# Patient Record
Sex: Male | Born: 1975 | Race: Black or African American | Hispanic: No | Marital: Single
Health system: Southern US, Community
[De-identification: ages and names within clinical notes are randomized; demographics above are authoritative.]

## PROBLEM LIST (undated history)

## (undated) DIAGNOSIS — I10 Essential (primary) hypertension: Secondary | ICD-10-CM

---

## 2018-04-12 ENCOUNTER — Emergency Department (HOSPITAL_COMMUNITY): Payer: Self-pay

## 2018-04-12 ENCOUNTER — Other Ambulatory Visit: Payer: Self-pay

## 2018-04-12 ENCOUNTER — Emergency Department (HOSPITAL_COMMUNITY)
Admission: EM | Admit: 2018-04-12 | Discharge: 2018-04-12 | Disposition: A | Discharge status: Home / Self Care | Payer: Self-pay | Attending: Emergency Medicine | Admitting: Emergency Medicine

## 2018-04-12 ENCOUNTER — Encounter (HOSPITAL_COMMUNITY): Payer: Self-pay | Admitting: Emergency Medicine

## 2018-04-12 DIAGNOSIS — N50811 Right testicular pain: Secondary | ICD-10-CM | POA: Insufficient documentation

## 2018-04-12 DIAGNOSIS — R1030 Lower abdominal pain, unspecified: Secondary | ICD-10-CM

## 2018-04-12 DIAGNOSIS — N50812 Left testicular pain: Secondary | ICD-10-CM | POA: Insufficient documentation

## 2018-04-12 DIAGNOSIS — N50819 Testicular pain, unspecified: Secondary | ICD-10-CM

## 2018-04-12 LAB — COMPREHENSIVE METABOLIC PANEL
ALT: 22 U/L (ref 0–44)
AST: 19 U/L (ref 15–41)
Albumin: 3.8 g/dL (ref 3.5–5.0)
Alkaline Phosphatase: 74 U/L (ref 38–126)
Anion gap: 6 (ref 5–15)
BUN: 9 mg/dL (ref 6–20)
CHLORIDE: 104 mmol/L (ref 98–111)
CO2: 28 mmol/L (ref 22–32)
Calcium: 9.1 mg/dL (ref 8.9–10.3)
Creatinine, Ser: 1.09 mg/dL (ref 0.61–1.24)
GFR calc Af Amer: >60 mL/min (ref >60)
GFR calc non Af Amer: >60 mL/min (ref >60)
Glucose, Bld: 93 mg/dL (ref 70–99)
Potassium: 3.6 mmol/L (ref 3.5–5.1)
Sodium: 138 mmol/L (ref 135–145)
Total Bilirubin: 0.8 mg/dL (ref 0.3–1.2)
Total Protein: 6.6 g/dL (ref 6.5–8.1)

## 2018-04-12 LAB — CBC WITH DIFFERENTIAL/PLATELET
Abs Immature Granulocytes: 0.03 10*3/uL (ref 0.00–0.07)
BASOS PCT: 1 %
Basophils Absolute: 0 10*3/uL (ref 0.0–0.1)
Eosinophils Absolute: 0.3 10*3/uL (ref 0.0–0.5)
Eosinophils Relative: 4 %
HCT: 41.5 % (ref 39.0–52.0)
Hemoglobin: 13.9 g/dL (ref 13.0–17.0)
Immature Granulocytes: 0 %
Lymphocytes Relative: 28 %
Lymphs Abs: 2 10*3/uL (ref 0.7–4.0)
MCH: 28.5 pg (ref 26.0–34.0)
MCHC: 33.5 g/dL (ref 30.0–36.0)
MCV: 85 fL (ref 80.0–100.0)
MONOS PCT: 6 %
Monocytes Absolute: 0.5 10*3/uL (ref 0.1–1.0)
Neutro Abs: 4.3 10*3/uL (ref 1.7–7.7)
Neutrophils Relative %: 61 %
Platelets: 183 10*3/uL (ref 150–400)
RBC: 4.88 MIL/uL (ref 4.22–5.81)
RDW: 13.6 % (ref 11.5–15.5)
WBC: 7.1 10*3/uL (ref 4.0–10.5)
nRBC: 0 % (ref 0.0–0.2)

## 2018-04-12 LAB — URINALYSIS, ROUTINE W REFLEX MICROSCOPIC
Bilirubin Urine: NEGATIVE
Glucose, UA: NEGATIVE mg/dL
Hgb urine dipstick: NEGATIVE
Ketones, ur: NEGATIVE mg/dL
Leukocytes,Ua: NEGATIVE
Nitrite: NEGATIVE
Protein, ur: NEGATIVE mg/dL
Specific Gravity, Urine: 1.015 (ref 1.005–1.030)
pH: 5 (ref 5.0–8.0)

## 2018-04-12 LAB — LIPASE, BLOOD: Lipase: 64 U/L — ABNORMAL HIGH (ref 11–51)

## 2018-04-12 MED ORDER — AZITHROMYCIN 250 MG PO TABS
1000.0000 mg | ORAL_TABLET | Freq: Once | ORAL | Status: AC
  Administered 2018-04-12: 1000 mg via ORAL
  Filled 2018-04-12: qty 4

## 2018-04-12 MED ORDER — IBUPROFEN 600 MG PO TABS: 600.0000 mg | ORAL_TABLET | Freq: Four times a day (QID) | ORAL | 0 refills | Status: AC | PRN

## 2018-04-12 MED ORDER — CEFTRIAXONE SODIUM 250 MG IJ SOLR
250.0000 mg | Freq: Once | INTRAMUSCULAR | Status: AC
  Administered 2018-04-12: 250 mg via INTRAMUSCULAR
  Filled 2018-04-12: qty 250

## 2018-04-12 MED ORDER — DOXYCYCLINE HYCLATE 100 MG PO CAPS: 100.0000 mg | ORAL_CAPSULE | Freq: Two times a day (BID) | ORAL | 0 refills | Status: AC

## 2018-04-12 NOTE — ED Provider Notes (Signed)
[facility] EMERGENCY DEPARTMENT Provider Note   CSN: [id number] Arrival date & time: 04/12/18  1247    History   Chief Complaint Chief Complaint  Patient presents with  . Abdominal Pain    HPI Kenneth Banks is a 43 y.o. male.     The history is provided by the patient. No language interpreter was used.  Abdominal Pain     43 year old male here with abd pain patient report for the past week he has had generalized abdominal pain.  He described pain as a gradual onset, pressure sensation to his mid abdomen that is constant rates as 4 out of 10.  He also endorsed tenderness to both of the testicles at the same time.  Pain has been present.  He denies any associated fever chills no nausea vomiting diarrhea dysuria hematuria penile discharge or rash.  He is sexually active and using protection.  He report remote history of chlamydia infection.  He denies any postprandial pain and no back pain.  No recent injury.  No specific treatment tried.  Patient works night shift and today is the first day off thus giving him the options to come to the ER.    History reviewed. No pertinent past medical history.  There are no active problems to display for this patient.   The histories are not reviewed yet. Please review them in the "History" navigator section and refresh this SmartLink.      Home Medications    Prior to Admission medications   Not on File    Family History No family history on file.  Social History Social History   Tobacco Use  . Smoking status: Not on file  Substance Use Topics  . Alcohol use: Not on file  . Drug use: Not on file     Allergies   Patient has no allergy information on record.   Review of Systems Review of Systems  Gastrointestinal: Positive for abdominal pain.  All other systems reviewed and are negative.    Physical Exam Updated Vital Signs BP (!) 148/83 (BP Location: Right Arm)   Pulse 61   Temp 98.2 F (36.8  C) (Oral)   Resp 16   Ht 5' 11.5" (1.816 m)   SpO2 100%   Physical Exam Vitals signs and nursing note reviewed. Exam conducted with a chaperone present.  Constitutional:      General: He is not in acute distress.    Appearance: He is well-developed.  HENT:     Head: Atraumatic.  Eyes:     Conjunctiva/sclera: Conjunctivae normal.  Neck:     Musculoskeletal: Neck supple.  Cardiovascular:     Rate and Rhythm: Normal rate and regular rhythm.  Pulmonary:     Effort: Pulmonary effort is normal.     Breath sounds: Normal breath sounds.  Abdominal:     General: Abdomen is flat. Bowel sounds are normal.     Palpations: Abdomen is soft.     Tenderness: There is no abdominal tenderness.     Hernia: No hernia is present.  Genitourinary:    Penis: Normal.      Scrotum/Testes:        Right: Tenderness present. Mass or swelling not present.        Left: Tenderness present. Mass or swelling not present.  Skin:    Findings: No rash.  Neurological:     Mental Status: He is alert.  Psychiatric:        Mood  and Affect: Mood normal.      ED Treatments / Results  Labs (all labs ordered are listed, but only abnormal results are displayed) Labs Reviewed  LIPASE, BLOOD - Abnormal; Notable for the following components:      Result Value   Lipase 64 (*)    All other components within normal limits  CBC WITH DIFFERENTIAL/PLATELET  COMPREHENSIVE METABOLIC PANEL  URINALYSIS, ROUTINE W REFLEX MICROSCOPIC  RPR  HIV ANTIBODY (ROUTINE TESTING W REFLEX)  GC/CHLAMYDIA PROBE AMP ([place]) [facility]    EKG None  Radiology [place] Scrotum W/doppler  Result Date: 04/12/2018 CLINICAL DATA:  Bilateral testicular pain for 5 days EXAM: SCROTAL ULTRASOUND DOPPLER ULTRASOUND OF THE TESTICLES TECHNIQUE: Complete ultrasound examination of the testicles, epididymis, and other scrotal structures was performed. Color and spectral Doppler ultrasound were also utilized to evaluate blood flow to the  testicles. COMPARISON:  None. FINDINGS: Right testicle Measurements: 49 x 25 x 36 mm.  No mass or abnormal vascularity Left testicle Measurements:  48 x 23 x 32 mm.  No mass or abnormal vascularity. Right epididymis:  Incidental 6 mm cyst. Left epididymis: Mildly thicker than the right but no clear asymmetric vascularity. Hydrocele:  None visualized. Varicocele:  None visualized. Pulsed Doppler interrogation of both testes demonstrates normal low resistance arterial and venous waveforms bilaterally. IMPRESSION: 1. Mild comparative thickening of the left epididymis without hypervascularity to confirm epididymitis. Please correlate for focal symptoms in this patient with provided history of bilateral testicular pain. 2. Normal appearance of the testicles. Electronically Signed   By: Marnee SpringJonathon  Watts M.D.   On: 04/12/2018 15:35    Procedures Procedures (including critical care time)  Medications Ordered in ED Medications  cefTRIAXone (ROCEPHIN) injection 250 mg (has no administration in time range)  azithromycin (ZITHROMAX) tablet 1,000 mg (has no administration in time range)     Initial Impression / Assessment and Plan / ED Course  I have reviewed the triage vital signs and the nursing notes.  Pertinent labs & imaging results that were available during my care of the patient were reviewed by me and considered in my medical decision making (see chart for details).        BP (!) 144/81   Pulse 70   Temp 98.2 F (36.8 C) (Oral)   Resp 16   Ht 5' 11.5" (1.816 m)   SpO2 99%    Final Clinical Impressions(s) / ED Diagnoses   Final diagnoses:  Lower abdominal pain  Testicular pain    ED Discharge Orders         Ordered    doxycycline (VIBRAMYCIN) 100 MG capsule  2 times daily     04/12/18 1552    ibuprofen (ADVIL,MOTRIN) 600 MG tablet  Every 6 hours PRN     04/12/18 1552         1:37 PM Patient complaining of pain to the pit of his abdomen as well as bilateral testicle pain  right greater than left.  Pain is been present for nearly a week.  Pain is not significant.  On exam he does have some palpable mass on his right testicle suggestive of hydrocele versus varicocele.  No hernia noted.  Given his discomfort and concern, will obtain scrotal ultrasound to assess for potential mass or torsion.  We will also screen perform STI screening.  Work-up initiated.  Patient otherwise well-appearing   3:42 PM UA without any signs of urinary tract infection, normal WBC, normal H&H mild elevated lipase of  64 however abdomen is nontender to the left upper quadrant to suggest pancreatitis.  Scrotal ultrasound demonstrate mild comparative thickening of the left epididymis without hypervascularity to confirm epididymitis.  Incidental right epididymal cyst measuring approximately 6 mm.  Given that patient has new sexual partner, having bilateral testicles pain with mild thickening of the epididymis, patient will receive antibiotic to treat for suspected epididymitis.   Fayrene Helper, PA-C 04/12/18 1554    Gerhard Munch, MD 04/16/18 2114

## 2018-04-12 NOTE — ED Triage Notes (Signed)
Pt endorses generalized abd pain x1 week. Denies any N/V/D. States it feels like a pressure

## 2018-04-12 NOTE — ED Notes (Signed)
Patient transported to Ultrasound 

## 2018-04-12 NOTE — Discharge Instructions (Signed)
Take antibiotic as prescribed as treatment for inflammation and likely infection of your epididymal region.  Return if your condition worsen or if you have any concerns.

## 2018-04-12 NOTE — ED Notes (Signed)
Patient verbalizes understanding of discharge instructions. Opportunity for questioning and answers were provided. Armband removed by staff, pt discharged from ED ambulatory to home.  

## 2018-04-13 LAB — HIV ANTIBODY (ROUTINE TESTING W REFLEX): HIV Screen 4th Generation wRfx: NONREACTIVE

## 2018-04-13 LAB — RPR: RPR Ser Ql: NONREACTIVE

## 2018-04-14 LAB — GC/CHLAMYDIA PROBE AMP (~~LOC~~) NOT AT ARMC
Chlamydia: NEGATIVE
Neisseria Gonorrhea: NEGATIVE

## 2018-05-04 ENCOUNTER — Other Ambulatory Visit: Payer: Self-pay

## 2018-05-04 ENCOUNTER — Emergency Department (HOSPITAL_COMMUNITY)
Admission: EM | Admit: 2018-05-04 | Discharge: 2018-05-04 | Disposition: A | Discharge status: Home / Self Care | Payer: Self-pay | Attending: Emergency Medicine | Admitting: Emergency Medicine

## 2018-05-04 ENCOUNTER — Encounter (HOSPITAL_COMMUNITY): Payer: Self-pay | Admitting: Emergency Medicine

## 2018-05-04 DIAGNOSIS — R1010 Upper abdominal pain, unspecified: Secondary | ICD-10-CM

## 2018-05-04 DIAGNOSIS — K2970 Gastritis, unspecified, without bleeding: Secondary | ICD-10-CM | POA: Insufficient documentation

## 2018-05-04 LAB — CBC WITH DIFFERENTIAL/PLATELET
Abs Immature Granulocytes: 0.02 10*3/uL (ref 0.00–0.07)
Basophils Absolute: 0 10*3/uL (ref 0.0–0.1)
Basophils Relative: 1 %
Eosinophils Absolute: 0.1 10*3/uL (ref 0.0–0.5)
Eosinophils Relative: 2 %
HCT: 39.4 % (ref 39.0–52.0)
Hemoglobin: 12.9 g/dL — ABNORMAL LOW (ref 13.0–17.0)
Immature Granulocytes: 0 %
Lymphocytes Relative: 31 %
Lymphs Abs: 1.5 10*3/uL (ref 0.7–4.0)
MCH: 27.6 pg (ref 26.0–34.0)
MCHC: 32.7 g/dL (ref 30.0–36.0)
MCV: 84.2 fL (ref 80.0–100.0)
MONO ABS: 0.4 10*3/uL (ref 0.1–1.0)
Monocytes Relative: 8 %
Neutro Abs: 2.9 10*3/uL (ref 1.7–7.7)
Neutrophils Relative %: 58 %
Platelets: 175 10*3/uL (ref 150–400)
RBC: 4.68 MIL/uL (ref 4.22–5.81)
RDW: 13.7 % (ref 11.5–15.5)
WBC: 4.9 10*3/uL (ref 4.0–10.5)
nRBC: 0 % (ref 0.0–0.2)

## 2018-05-04 LAB — URINALYSIS, ROUTINE W REFLEX MICROSCOPIC
Bilirubin Urine: NEGATIVE
Glucose, UA: NEGATIVE mg/dL
Hgb urine dipstick: NEGATIVE
Ketones, ur: NEGATIVE mg/dL
Leukocytes,Ua: NEGATIVE
NITRITE: NEGATIVE
PROTEIN: NEGATIVE mg/dL
Specific Gravity, Urine: 1.015 (ref 1.005–1.030)
pH: 5 (ref 5.0–8.0)

## 2018-05-04 LAB — COMPREHENSIVE METABOLIC PANEL
ALT: 20 U/L (ref 0–44)
AST: 17 U/L (ref 15–41)
Albumin: 3.7 g/dL (ref 3.5–5.0)
Alkaline Phosphatase: 70 U/L (ref 38–126)
Anion gap: 5 (ref 5–15)
BUN: 10 mg/dL (ref 6–20)
CO2: 25 mmol/L (ref 22–32)
CREATININE: 1.17 mg/dL (ref 0.61–1.24)
Calcium: 8.8 mg/dL — ABNORMAL LOW (ref 8.9–10.3)
Chloride: 107 mmol/L (ref 98–111)
GFR calc non Af Amer: >60 mL/min (ref >60)
Glucose, Bld: 105 mg/dL — ABNORMAL HIGH (ref 70–99)
Potassium: 3.7 mmol/L (ref 3.5–5.1)
Sodium: 137 mmol/L (ref 135–145)
Total Bilirubin: 0.6 mg/dL (ref 0.3–1.2)
Total Protein: 6.2 g/dL — ABNORMAL LOW (ref 6.5–8.1)

## 2018-05-04 LAB — LIPASE, BLOOD: Lipase: 21 U/L (ref 11–51)

## 2018-05-04 MED ORDER — ALUM & MAG HYDROXIDE-SIMETH 200-200-20 MG/5ML PO SUSP
30.0000 mL | Freq: Once | ORAL | Status: AC
  Administered 2018-05-04: 30 mL via ORAL
  Filled 2018-05-04: qty 30

## 2018-05-04 MED ORDER — RANITIDINE HCL 150 MG PO TABS: 150.0000 mg | ORAL_TABLET | Freq: Two times a day (BID) | ORAL | 0 refills | Status: AC

## 2018-05-04 MED ORDER — LIDOCAINE VISCOUS HCL 2 % MT SOLN
15.0000 mL | Freq: Once | OROMUCOSAL | Status: AC
  Administered 2018-05-04: 15 mL via ORAL
  Filled 2018-05-04: qty 15

## 2018-05-04 NOTE — ED Notes (Signed)
Pt aware of need for urine specimen. 

## 2018-05-04 NOTE — ED Notes (Signed)
Patient verbalizes understanding of discharge instructions. Opportunity for questioning and answers were provided. Armband removed by staff, pt discharged from ED.  

## 2018-05-04 NOTE — Discharge Instructions (Signed)
Your abdominal pain is likely from indigestion or could be from gastritis or an ulcer. You will need to take zantac as directed, and avoid spicy/fatty/acidic foods, avoid soda/coffee/tea/alcohol. Avoid laying down flat within 30 minutes of eating. Avoid NSAIDs like ibuprofen/aleve/motrin/etc on an empty stomach. May consider using over the counter tums/maalox as needed for additional relief. Use tylenol as needed for pain. Follow up with your regular doctor in 5-7 days for recheck of symptoms. Return to the ER for changes or worsening symptoms.  Abdominal (belly) pain can be caused by many things. Your caregiver performed an examination and possibly ordered blood/urine tests and imaging (CT scan, x-rays, ultrasound). Many cases can be observed and treated at home after initial evaluation in the emergency department. Even though you are being discharged home, abdominal pain can be unpredictable. Therefore, you need a repeated exam if your pain does not resolve, returns, or worsens. Most patients with abdominal pain don't have to be admitted to the hospital or have surgery, but serious problems like appendicitis and gallbladder attacks can start out as nonspecific pain. Many abdominal conditions cannot be diagnosed in one visit, so follow-up evaluations are very important. SEEK IMMEDIATE MEDICAL ATTENTION IF YOU DEVELOP ANY OF THE FOLLOWING SYMPTOMS: The pain does not go away or becomes severe.  A temperature above 101 develops.  Repeated vomiting occurs (multiple episodes).  The pain becomes localized to portions of the abdomen. The right side could possibly be appendicitis. In an adult, the left lower portion of the abdomen could be colitis or diverticulitis.  Blood is being passed in stools or vomit (bright red or black tarry stools).  Return also if you develop chest pain, difficulty breathing, dizziness or fainting, or become confused, poorly responsive, or inconsolable (young children). The  constipation stays for more than 4 days.  There is belly (abdominal) or rectal pain.  You do not seem to be getting better.

## 2018-05-04 NOTE — ED Provider Notes (Signed)
[facility] EMERGENCY DEPARTMENT Provider Note   CSN: [id number] Arrival date & time: 05/04/18  1310    History   Chief Complaint Chief Complaint  Patient presents with  . Abdominal Pain    HPI    Kenneth Banks is a 43 y.o. male who presents to the ED with complaints of ongoing abd pain that initially began about a month ago.  Chart review reveals that he was seen in the ED on 04/12/18 for abd pain/testicle pain, labs were reassuring including negative RPR/HIV/GC/CT testing, he had a scrotum U/S which showed mild thickening of left epididymis without hypervascularity to suggest epididymitis, recommended clinical correlation. He was treated with azithryomycin and rocephin in the ED and discharged with doxycycline x1wk. he states that after he finishes antibiotic he felt somewhat better but about 2 days after that his pain returned.  He finished the antibiotic sometime last week.  He describes his pain is 6/10 constant generalized domino pressure, nonradiating, worse with eating too much, and with no treatments tried prior to arrival.  He states that his testicular pain has improved.  He denies any fevers, chills, chest pain, shortness of breath, nausea, vomiting, diarrhea, constipation, dysuria, hematuria, testicular pain or swelling, penile discharge, numbness, tingling, focal weakness, or any other complaints at this time.  He denies any recent travel, sick contacts, suspicious food intake, alcohol use, NSAID use, or prior abdominal surgeries.  The history is provided by the patient and medical records. No language interpreter was used.  Abdominal Pain  Associated symptoms: no chest pain, no chills, no constipation, no diarrhea, no dysuria, no fever, no hematuria, no nausea, no shortness of breath and no vomiting     History reviewed. No pertinent past medical history.  There are no active problems to display for this patient.   History reviewed. No pertinent surgical  history.      Home Medications    Prior to Admission medications   Medication Sig Start Date End Date Taking? Authorizing Provider  doxycycline (VIBRAMYCIN) 100 MG capsule Take 1 capsule (100 mg total) by mouth 2 (two) times daily. One po bid x 7 days 04/12/18   Fayrene Helper, PA-C  ibuprofen (ADVIL,MOTRIN) 600 MG tablet Take 1 tablet (600 mg total) by mouth every 6 (six) hours as needed. 04/12/18   Fayrene Helper, PA-C    Family History No family history on file.  Social History Social History   Tobacco Use  . Smoking status: Not on file  Substance Use Topics  . Alcohol use: Not on file  . Drug use: Not on file     Allergies   Patient has no known allergies.   Review of Systems Review of Systems  Constitutional: Negative for chills and fever.  Respiratory: Negative for shortness of breath.   Cardiovascular: Negative for chest pain.  Gastrointestinal: Positive for abdominal pain. Negative for constipation, diarrhea, nausea and vomiting.  Genitourinary: Negative for discharge, dysuria, hematuria, scrotal swelling and testicular pain.  Musculoskeletal: Negative for arthralgias and myalgias.  Skin: Negative for color change.  Allergic/Immunologic: Negative for immunocompromised state.  Neurological: Negative for weakness and numbness.  Psychiatric/Behavioral: Negative for confusion.   All other systems reviewed and are negative for acute change except as noted in the HPI.    Physical Exam Updated Vital Signs BP 137/88   Pulse 61   Temp 98.8 F (37.1 C) (Oral)   Wt 110.7 kg   SpO2 98%   BMI 33.56 kg/m   Physical Exam  Vitals signs and nursing note reviewed.  Constitutional:      General: He is not in acute distress.    Appearance: Normal appearance. He is well-developed. He is not toxic-appearing.     Comments: Afebrile, nontoxic, NAD  HENT:     Head: Normocephalic and atraumatic.  Eyes:     General:        Right eye: No discharge.        Left eye: No discharge.      Conjunctiva/sclera: Conjunctivae normal.  Neck:     Musculoskeletal: Normal range of motion and neck supple.  Cardiovascular:     Rate and Rhythm: Normal rate and regular rhythm.     Pulses: Normal pulses.     Heart sounds: Normal heart sounds, S1 normal and S2 normal. No murmur. No friction rub. No gallop.   Pulmonary:     Effort: Pulmonary effort is normal. No respiratory distress.     Breath sounds: Normal breath sounds. No decreased breath sounds, wheezing, rhonchi or rales.  Abdominal:     General: Bowel sounds are normal. There is no distension.     Palpations: Abdomen is soft. Abdomen is not rigid.     Tenderness: There is abdominal tenderness in the right upper quadrant, epigastric area and left upper quadrant. There is no right CVA tenderness, left CVA tenderness, guarding or rebound. Negative signs include Murphy's sign and McBurney's sign.     Comments: Soft, nondistended, +BS throughout, with mild upper abd TTP, no r/g/r, neg murphy's, neg mcburney's, no CVA TTP   Musculoskeletal: Normal range of motion.  Skin:    General: Skin is warm and dry.     Findings: No rash.  Neurological:     Mental Status: He is alert and oriented to person, place, and time.     Sensory: Sensation is intact. No sensory deficit.     Motor: Motor function is intact.  Psychiatric:        Mood and Affect: Mood and affect normal.        Behavior: Behavior normal.      ED Treatments / Results  Labs (all labs ordered are listed, but only abnormal results are displayed) Labs Reviewed  CBC WITH DIFFERENTIAL/PLATELET - Abnormal; Notable for the following components:      Result Value   Hemoglobin 12.9 (*)    All other components within normal limits  COMPREHENSIVE METABOLIC PANEL - Abnormal; Notable for the following components:   Glucose, Bld 105 (*)    Calcium 8.8 (*)    Total Protein 6.2 (*)    All other components within normal limits  LIPASE, BLOOD  URINALYSIS, ROUTINE W REFLEX  MICROSCOPIC    EKG None  Radiology No results found.  Procedures Procedures (including critical care time)  Medications Ordered in ED Medications  alum & mag hydroxide-simeth (MAALOX/MYLANTA) 200-200-20 MG/5ML suspension 30 mL (30 mLs Oral Given 05/04/18 1353)    And  lidocaine (XYLOCAINE) 2 % viscous mouth solution 15 mL (15 mLs Oral Given 05/04/18 1353)     Initial Impression / Assessment and Plan / ED Course  I have reviewed the triage vital signs and the nursing notes.  Pertinent labs & imaging results that were available during my care of the patient were reviewed by me and considered in my medical decision making (see chart for details).        43 y.o. male here with abd pain x1 month, was seen on 04/12/18 for same and was treated  for presumptive epididymitis because U/S of scrotum showed left epididymal thickening without hypervascularity. His HIV/RPR/GC/CT testing was negative, and his U/A and labs were unremarkable. No longer having testicular pain, but still having abd pain since he finished his doxycycline. On exam, very mild upper abd TTP, neg murphy's, no lower abd tenderness, nonperitoneal. Suspect indigestion/gastritis given his description of the pain. Doubt need for imaging at this time, will get labs and give GI cocktail and reassess shortly.   4:22 PM CBC w/diff fairly unremarkable. CMP essentially unremarkable. Lipase WNL. U/A unremarkable without evidence of infection. Pt feeling better. Overall symptoms c/w indigestion/gastritis vs PUD, doubt other emergent pathology, doubt need for further emergent work up at this time. Will start on zantac. Discussed diet/lifestyle modifications for symptoms, advised tylenol and avoidance/sparing use of NSAIDs only on full stomach, discussed other OTC remedies for symptomatic relief, and f/up with PCP in 5-7 days for recheck of symptoms and ongoing evaluation/management. I explained the diagnosis and have given explicit precautions  to return to the ER including for any other new or worsening symptoms. The patient understands and accepts the medical plan as it's been dictated and I have answered their questions. Discharge instructions concerning home care and prescriptions have been given. The patient is STABLE and is discharged to home in good condition.      Final Clinical Impressions(s) / ED Diagnoses   Final diagnoses:  Upper abdominal pain  Gastritis, presence of bleeding unspecified, unspecified chronicity, unspecified gastritis type    ED Discharge Orders         Ordered    ranitidine (ZANTAC) 150 MG tablet  2 times daily     05/04/18 [address] [place], [place] 05/04/18 1622    Margarita Grizzle, MD 05/08/18 1141

## 2018-05-04 NOTE — ED Triage Notes (Signed)
Pt here for abdominal pain for 1 month. Pt started on antibiotics 3 weeks ago for "infection" and states stomach is still hurting. 6/10 generalized abdominal pain.

## 2019-01-19 ENCOUNTER — Other Ambulatory Visit: Payer: Self-pay

## 2019-01-19 ENCOUNTER — Encounter (HOSPITAL_COMMUNITY): Payer: Self-pay

## 2019-01-19 ENCOUNTER — Emergency Department (HOSPITAL_COMMUNITY)
Admission: EM | Admit: 2019-01-19 | Discharge: 2019-01-19 | Disposition: A | Discharge status: Home / Self Care | Payer: Self-pay | Attending: Emergency Medicine | Admitting: Emergency Medicine

## 2019-01-19 DIAGNOSIS — R1013 Epigastric pain: Secondary | ICD-10-CM | POA: Insufficient documentation

## 2019-01-19 DIAGNOSIS — Z79899 Other long term (current) drug therapy: Secondary | ICD-10-CM | POA: Insufficient documentation

## 2019-01-19 DIAGNOSIS — I10 Essential (primary) hypertension: Secondary | ICD-10-CM | POA: Insufficient documentation

## 2019-01-19 HISTORY — DX: Essential (primary) hypertension: I10

## 2019-01-19 LAB — COMPREHENSIVE METABOLIC PANEL
ALT: 23 U/L (ref 0–44)
AST: 19 U/L (ref 15–41)
Albumin: 4 g/dL (ref 3.5–5.0)
Alkaline Phosphatase: 59 U/L (ref 38–126)
Anion gap: 10 (ref 5–15)
BUN: 14 mg/dL (ref 6–20)
CO2: 28 mmol/L (ref 22–32)
Calcium: 9.3 mg/dL (ref 8.9–10.3)
Chloride: 102 mmol/L (ref 98–111)
Creatinine, Ser: 1.08 mg/dL (ref 0.61–1.24)
GFR calc Af Amer: >60 mL/min (ref >60)
GFR calc non Af Amer: >60 mL/min (ref >60)
Glucose, Bld: 93 mg/dL (ref 70–99)
Potassium: 3.6 mmol/L (ref 3.5–5.1)
Sodium: 140 mmol/L (ref 135–145)
Total Bilirubin: 0.9 mg/dL (ref 0.3–1.2)
Total Protein: 6.7 g/dL (ref 6.5–8.1)

## 2019-01-19 LAB — CBC
HCT: 40.3 % (ref 39.0–52.0)
Hemoglobin: 13.6 g/dL (ref 13.0–17.0)
MCH: 28.6 pg (ref 26.0–34.0)
MCHC: 33.7 g/dL (ref 30.0–36.0)
MCV: 84.8 fL (ref 80.0–100.0)
Platelets: 176 10*3/uL (ref 150–400)
RBC: 4.75 MIL/uL (ref 4.22–5.81)
RDW: 13.8 % (ref 11.5–15.5)
WBC: 6 10*3/uL (ref 4.0–10.5)
nRBC: 0 % (ref 0.0–0.2)

## 2019-01-19 LAB — URINALYSIS, ROUTINE W REFLEX MICROSCOPIC
Bilirubin Urine: NEGATIVE
Glucose, UA: NEGATIVE mg/dL
Hgb urine dipstick: NEGATIVE
Ketones, ur: NEGATIVE mg/dL
Leukocytes,Ua: NEGATIVE
Nitrite: NEGATIVE
Protein, ur: NEGATIVE mg/dL
Specific Gravity, Urine: 1.014 (ref 1.005–1.030)
pH: 5 (ref 5.0–8.0)

## 2019-01-19 LAB — LIPASE, BLOOD: Lipase: 48 U/L (ref 11–51)

## 2019-01-19 MED ORDER — OMEPRAZOLE 20 MG PO CPDR: 20.0000 mg | DELAYED_RELEASE_CAPSULE | Freq: Every day | ORAL | 0 refills | Status: AC

## 2019-01-19 MED ORDER — LIDOCAINE VISCOUS HCL 2 % MT SOLN
15.0000 mL | Freq: Once | OROMUCOSAL | Status: AC
  Administered 2019-01-19: 15:00:00 15 mL via ORAL
  Filled 2019-01-19: qty 15

## 2019-01-19 MED ORDER — ALUM & MAG HYDROXIDE-SIMETH 200-200-20 MG/5ML PO SUSP
30.0000 mL | Freq: Once | ORAL | Status: AC
  Administered 2019-01-19: 15:00:00 30 mL via ORAL
  Filled 2019-01-19: qty 30

## 2019-01-19 NOTE — ED Provider Notes (Signed)
[facility] EMERGENCY DEPARTMENT Provider Note   CSN: [id number] Arrival date & time: 01/19/19  1246     History Chief Complaint  Patient presents with  . Abdominal Pain    Kenneth Banks is a 43 y.o. male.  43 y.o male with a PMH of HTN (non complaint with medication) presents to the ED with a chief complaint of epigastric pain x 4 days.  Patient describes this as a cramping located to her epigastric region with radiation to the upper abdomen.  He has tried taking some Tylenol 500 mg without improvement in his symptoms.  He does have a prior visit for upper abdominal pain reports this is similar.  Reports the pain is worse after eating, states it is very uncomfortable to eat.  She also endorses diarrhea, has had 3 episodes in the past 2 days, no blood in his stool.  Denies any fevers, nausea, vomiting, sick contacts.  No prior surgical history to his abdomen.  The history is provided by the patient and medical records.  Abdominal Pain Associated symptoms: diarrhea   Associated symptoms: no chest pain, no fever, no nausea, no shortness of breath, no sore throat and no vomiting        Past Medical History:  Diagnosis Date  . Hypertension     There are no problems to display for this patient.   History reviewed. No pertinent surgical history.     No family history on file.  Social History   Tobacco Use  . Smoking status: Not on file  Substance Use Topics  . Alcohol use: Not on file  . Drug use: Not on file    Home Medications Prior to Admission medications   Medication Sig Start Date End Date Taking? Authorizing Provider  doxycycline (VIBRAMYCIN) 100 MG capsule Take 1 capsule (100 mg total) by mouth 2 (two) times daily. One po bid x 7 days Patient not taking: Reported on 05/04/2018 04/12/18   Fayrene Helper, PA-C  ibuprofen (ADVIL,MOTRIN) 600 MG tablet Take 1 tablet (600 mg total) by mouth every 6 (six) hours as needed. Patient not taking: Reported on  05/04/2018 04/12/18   Fayrene Helper, PA-C  omeprazole (PRILOSEC) 20 MG capsule Take 1 capsule (20 mg total) by mouth daily for 10 days. 01/19/19 01/29/19  Claude Manges, PA-C  ranitidine (ZANTAC) 150 MG tablet Take 1 tablet (150 mg total) by mouth 2 (two) times daily. 05/04/18   Street, [place], PA-C    Allergies    Patient has no known allergies.  Review of Systems   Review of Systems  Constitutional: Negative for fever.  HENT: Negative for sore throat.   Respiratory: Negative for shortness of breath.   Cardiovascular: Negative for chest pain.  Gastrointestinal: Positive for abdominal pain and diarrhea. Negative for nausea and vomiting.  Genitourinary: Negative for flank pain.  Musculoskeletal: Negative for back pain.  Skin: Negative for pallor and wound.  Neurological: Negative for light-headedness and headaches.    Physical Exam Updated Vital Signs BP (!) 162/101   Pulse 68   Temp 98.5 F (36.9 C) (Oral)   Resp 16   Ht 5\' 11"  (1.803 m)   Wt 113.4 kg   SpO2 98%   BMI 34.87 kg/m   Physical Exam Vitals and nursing note reviewed.  Constitutional:      Appearance: He is well-developed.  HENT:     Head: Normocephalic and atraumatic.  Eyes:     General: No scleral icterus.    Pupils: Pupils  are equal, round, and reactive to light.  Cardiovascular:     Heart sounds: Normal heart sounds.  Pulmonary:     Effort: Pulmonary effort is normal.     Breath sounds: Normal breath sounds. No wheezing.  Chest:     Chest wall: No tenderness.  Abdominal:     General: Abdomen is flat. Bowel sounds are normal. There is no distension.     Palpations: Abdomen is soft.     Tenderness: There is abdominal tenderness in the epigastric area.     Comments: Bowel sounds present, mild tenderness to palpation in the epigastric region.  No guarding or rebound.  Musculoskeletal:        General: No tenderness or deformity.     Cervical back: Normal range of motion.  Skin:    General: Skin is warm  and dry.  Neurological:     Mental Status: He is alert and oriented to person, place, and time.     ED Results / Procedures / Treatments   Labs (all labs ordered are listed, but only abnormal results are displayed) Labs Reviewed  LIPASE, BLOOD  COMPREHENSIVE METABOLIC PANEL  CBC  URINALYSIS, ROUTINE W REFLEX MICROSCOPIC    EKG None  Radiology No results found.  Procedures Procedures (including critical care time)  Medications Ordered in ED Medications  alum & mag hydroxide-simeth (MAALOX/MYLANTA) 200-200-20 MG/5ML suspension 30 mL (30 mLs Oral Given 01/19/19 1431)    And  lidocaine (XYLOCAINE) 2 % viscous mouth solution 15 mL (15 mLs Oral Given 01/19/19 1431)    ED Course  I have reviewed the triage vital signs and the nursing notes.  Pertinent labs & imaging results that were available during my care of the patient were reviewed by me and considered in my medical decision making (see chart for details).    MDM Rules/Calculators/A&P   Patient with a past medical history of high blood pressure currently noncompliant with medication as he reports "I have stopped eating spicy food that was causing my high blood pressure ".  Presents to the ED with complaints of gastric pain that is worse after eating, states he was seen for a similar complaint in the past, according to his records which have extensively reviewed he was placed on Zantac which helped that his symptoms.  Had no fever, nausea, he is nontoxic-appearing.  During my evaluation abdomen is soft, nontender to palpation, bowel sounds are present.  Discomfort seems to be coming from the epigastric region, no palpable spleno megaly, hepatomegaly.  He does report drinking occasionally.  No jaundice.  MP without any electrolyte abnormality, creatinine level is normal.  LFTs are unremarkable.  CBC without any leukocytosis.  Lipase level is within normal limits.  UA without any signs of infection, denies any urinary symptoms  at this time.  He was provided with GI cocktail which he reports improved significantly with his symptoms.  Suspect the patient symptoms are likely coming from reflux, will try him on a short course of PPIs to help with his symptoms, he is advised to follow-up with PCP, due to lack of PCP will send him over to the [facility] health and wellness clinic.  Patient understands and agrees with management.  Return precautions discussed at length.  Portions of this note were generated with [employer]. Dictation errors may occur despite best attempts at proofreading.  Final Clinical Impression(s) / ED Diagnoses Final diagnoses:  Epigastric pain    Rx / DC Orders ED Discharge Orders  Ordered    omeprazole (PRILOSEC) 20 MG capsule  Daily     01/19/19 1506           Claude MangesSoto, Jashay Roddy, PA-C 01/19/19 1510    Charlynne PanderYao, David Hsienta, MD 01/22/19 [phone]

## 2019-01-19 NOTE — ED Triage Notes (Signed)
Pt reports mid abd pain for the past 4 days, denies n/v/d. States pain is worse when he tries to eat.

## 2019-01-19 NOTE — Discharge Instructions (Signed)
Your laboratory results are within normal limits today.  I have prescribed medication to help with likely reflux, please take 1 tablet for the next 10 days.  He will need to follow-up with a primary care physician, the number to the [facility] health and wellness clinic is attached to your chart, please schedule an appointment at your earliest convenience.

## 2019-01-19 NOTE — ED Notes (Signed)
Pt discharge instructions and prescriptions reviewed with the patient. The patient verbalized understanding of discharge instructions. Pt discharged. 

## 2021-03-04 IMAGING — US US SCROTUM W/ DOPPLER COMPLETE
1 series · 14 of 25 positions shown · non-contrast
Comparison: None.

CLINICAL DATA: Bilateral testicular pain for 5 days

EXAM:
SCROTAL ULTRASOUND
DOPPLER ULTRASOUND OF THE TESTICLES
TECHNIQUE: Complete ultrasound examination of the testicles, epididymis, and
other scrotal structures was performed. Color and spectral Doppler
ultrasound were also utilized to evaluate blood flow to the
testicles.

[Series 1: us scrotum w/ doppler complete · 14 of 62 slices shown]
[im 1/62]
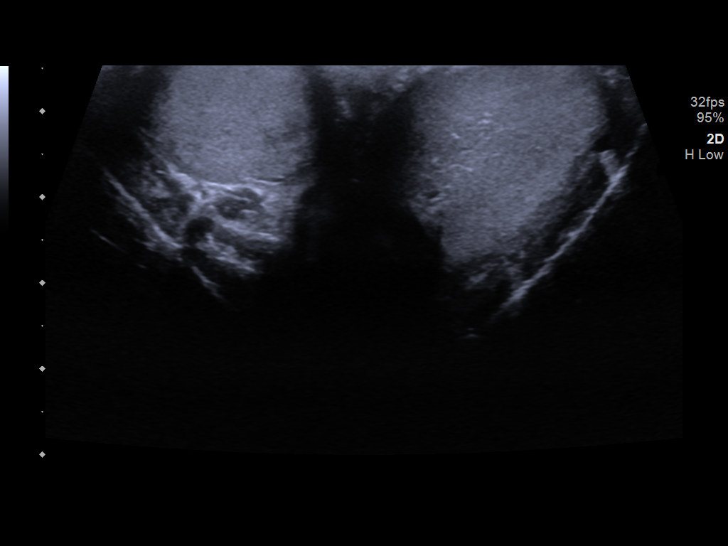
[im 6/62]
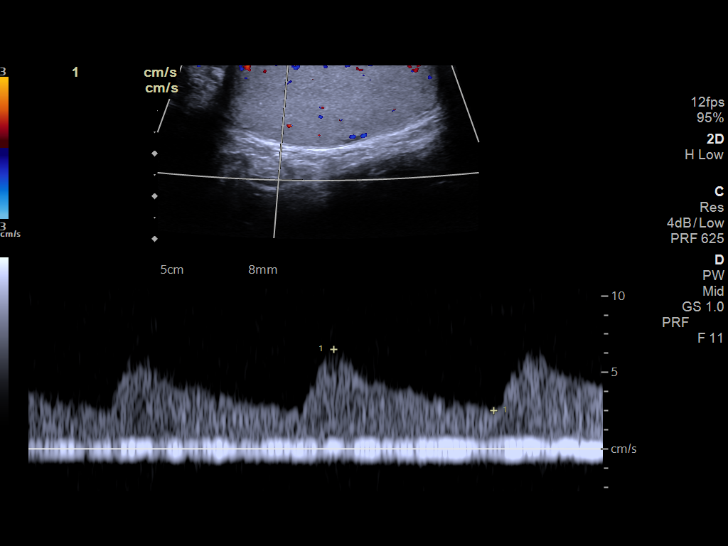
[im 11/62]
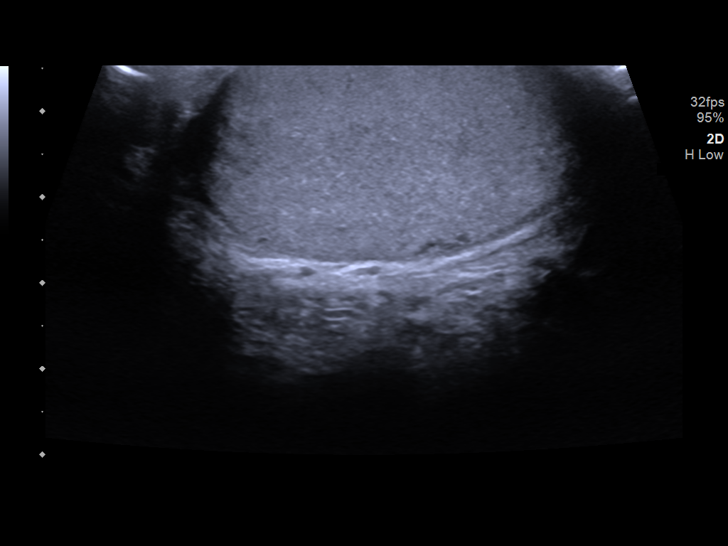
[im 16/62]
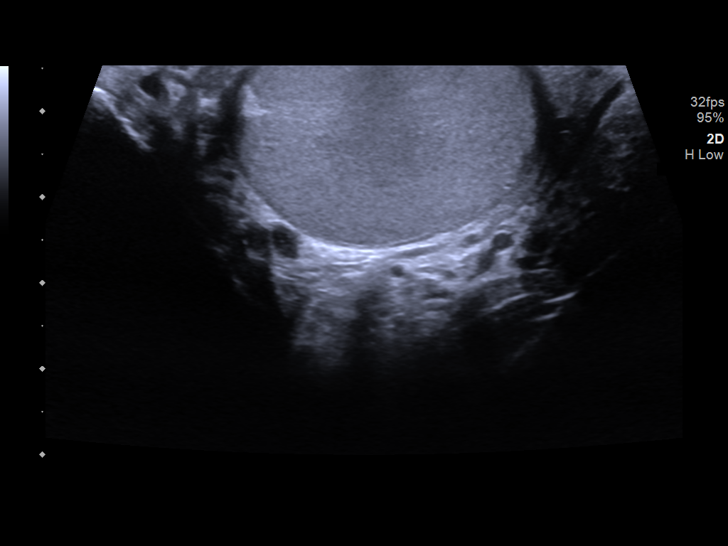
[im 21/62]
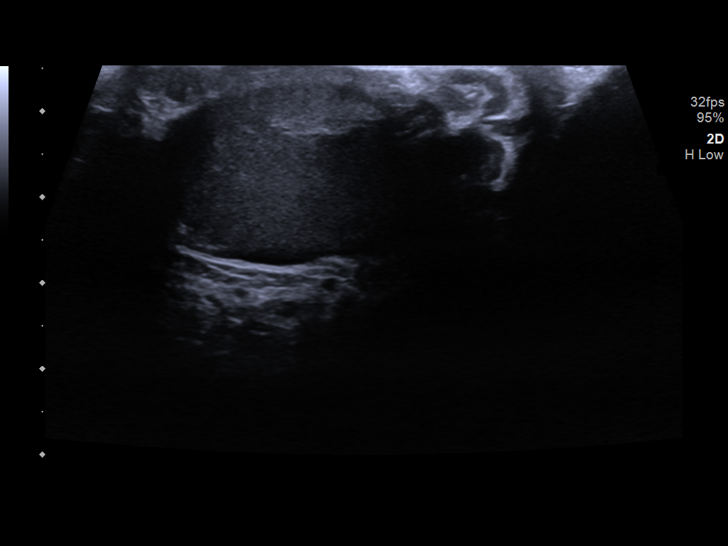
[im 23/62]
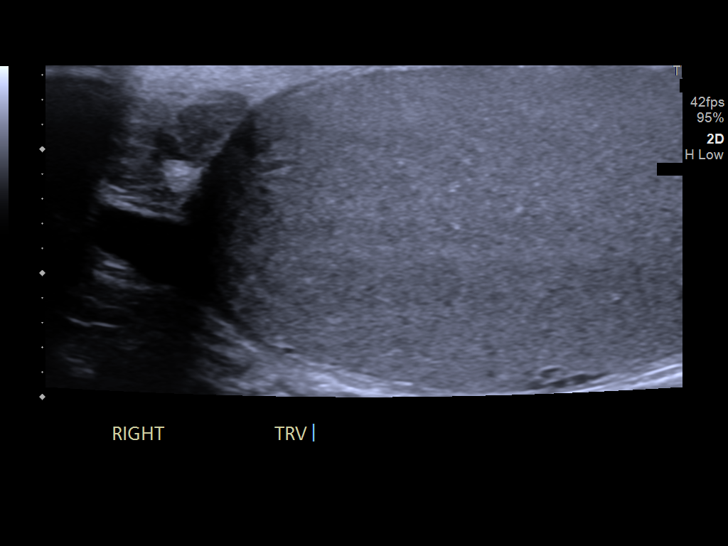
[im 28/62]
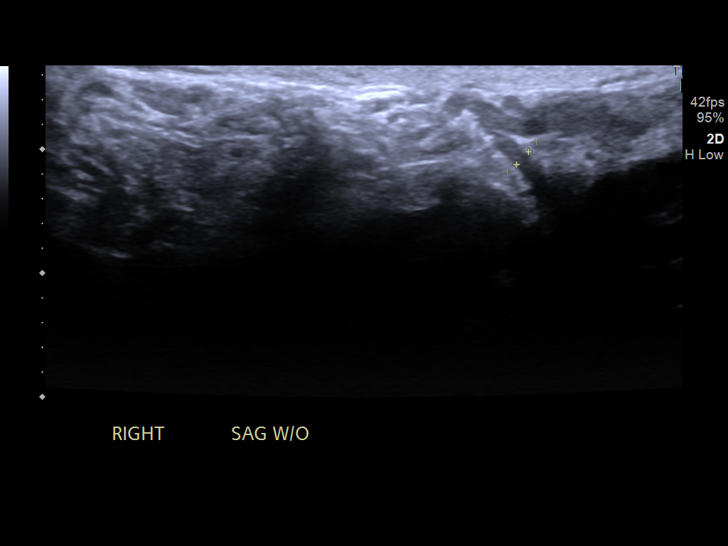
[im 34/62]
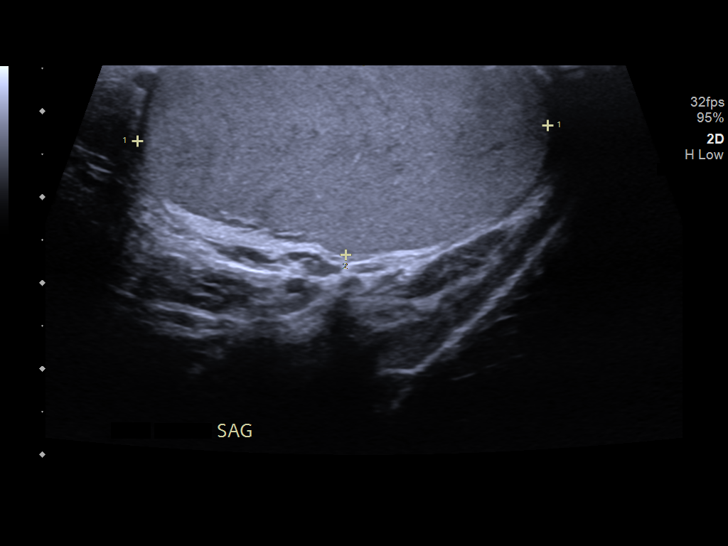
[im 39/62]
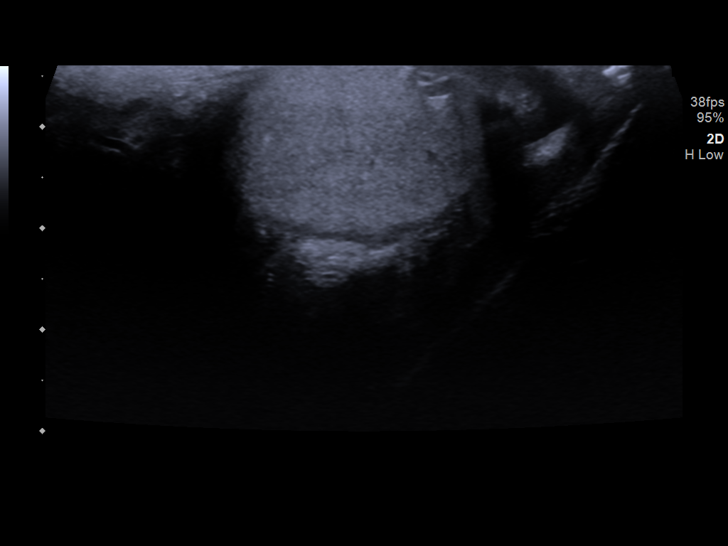
[im 41/62]
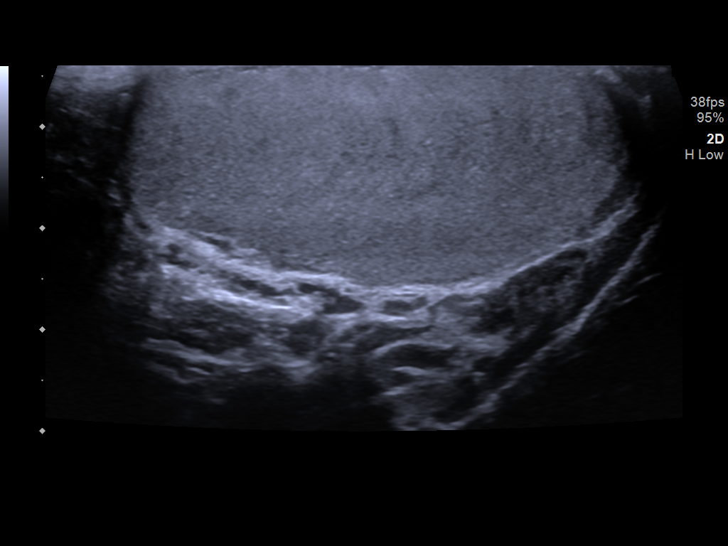
[im 46/62]
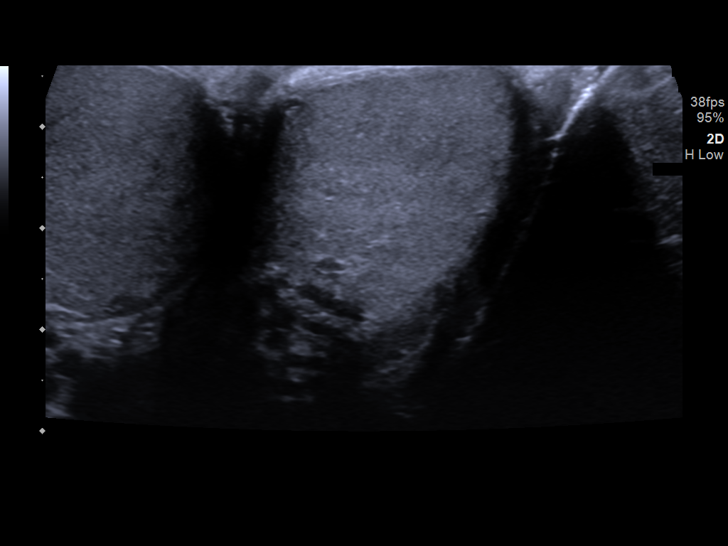
[im 51/62]
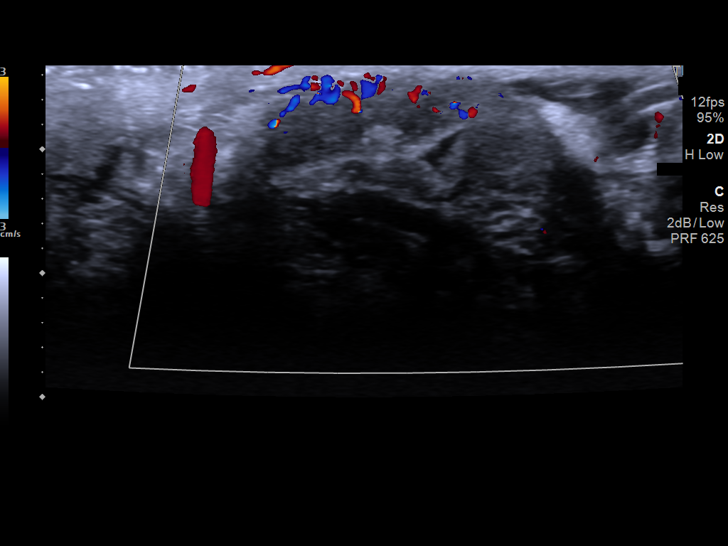
[im 56/62]
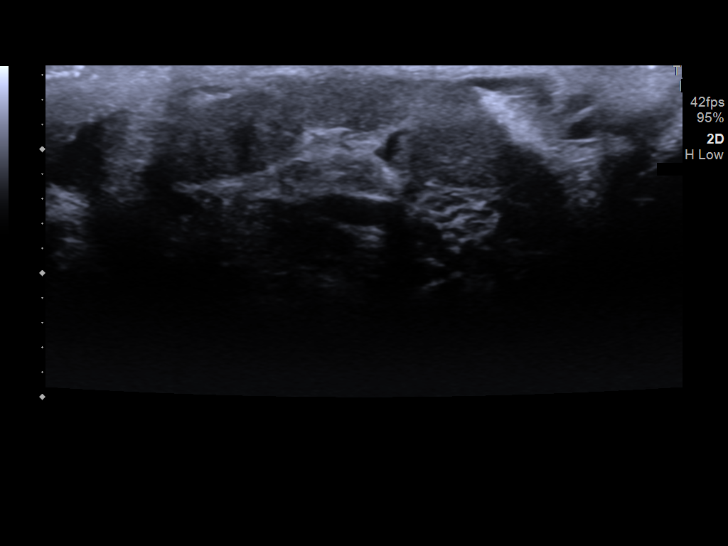
[im 62/62]
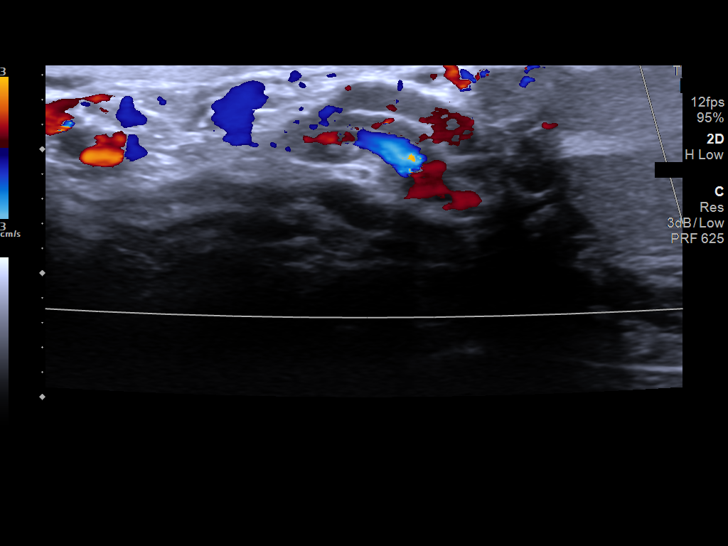

[14 of 25 positions shown; findings below may reference images not displayed]

FINDINGS: Right testicle

Measurements: 49 x 25 x 36 mm.  No mass or abnormal vascularity

Left testicle

Measurements:  48 x 23 x 32 mm.  No mass or abnormal vascularity.

Right epididymis:  Incidental 6 mm cyst.

Left epididymis: Mildly thicker than the right but no clear
asymmetric vascularity.

Hydrocele:  None visualized.

Varicocele:  None visualized.

Pulsed Doppler interrogation of both testes demonstrates normal low
resistance arterial and venous waveforms bilaterally.
IMPRESSION: 1. Mild comparative thickening of the left epididymis without
hypervascularity to confirm epididymitis. Please correlate for focal
symptoms in this patient with provided history of bilateral
testicular pain.
2. Normal appearance of the testicles.
